# Patient Record
Sex: Female | Born: 1985
Health system: Southern US, Community
[De-identification: ages and names within clinical notes are randomized; demographics above are authoritative.]

## PROBLEM LIST (undated history)

## (undated) DIAGNOSIS — J45909 Unspecified asthma, uncomplicated: Secondary | ICD-10-CM

---

## 2016-05-02 DIAGNOSIS — J020 Streptococcal pharyngitis: Secondary | ICD-10-CM | POA: Diagnosis not present

## 2016-08-25 ENCOUNTER — Encounter (HOSPITAL_COMMUNITY): Payer: Self-pay | Admitting: Emergency Medicine

## 2016-08-25 ENCOUNTER — Emergency Department (HOSPITAL_COMMUNITY)
Admission: EM | Admit: 2016-08-25 | Discharge: 2016-08-25 | Disposition: A | Discharge status: Home / Self Care | Payer: Medicaid Other | Attending: Emergency Medicine | Admitting: Emergency Medicine

## 2016-08-25 DIAGNOSIS — F17200 Nicotine dependence, unspecified, uncomplicated: Secondary | ICD-10-CM | POA: Diagnosis not present

## 2016-08-25 DIAGNOSIS — J45909 Unspecified asthma, uncomplicated: Secondary | ICD-10-CM | POA: Diagnosis not present

## 2016-08-25 DIAGNOSIS — H9202 Otalgia, left ear: Secondary | ICD-10-CM | POA: Insufficient documentation

## 2016-08-25 HISTORY — DX: Unspecified asthma, uncomplicated: J45.909

## 2016-08-25 HISTORY — DX: Morbid (severe) obesity due to excess calories: E66.01

## 2016-08-25 MED ORDER — LIDOCAINE HCL 2 % IJ SOLN
10.0000 mL | Freq: Once | INTRAMUSCULAR | Status: AC
  Administered 2016-08-25: 200 mg
  Filled 2016-08-25: qty 20

## 2016-08-25 MED ORDER — CIPROFLOXACIN-DEXAMETHASONE 0.3-0.1 % OT SUSP: 4.0000 [drp] | Freq: Two times a day (BID) | OTIC | 0 refills | Status: DC

## 2016-08-25 NOTE — ED Triage Notes (Signed)
Left ear pain states she has had some drainage and she has had a migraine may be from ear pain, sharp pain in ear

## 2016-08-25 NOTE — Discharge Instructions (Signed)
You have been seen today for ear pain. There appears to be an infection. Use 4 drops of the prescribed eardrops twice a day for 5 days. Follow-up with primary care provider on this matter. Follow up with the ear nose and throat specialist as needed. Return to the ED for worsening symptoms.  There was signs of ear wax buildup that can contribute to your symptoms. Refer to the attached information sheets.

## 2016-08-25 NOTE — ED Provider Notes (Signed)
[facility]-EMERGENCY DEPT Provider Note   CSN: [id number] Arrival date & time: 08/25/16  1457  By signing my name below, I, Paula Spence, attest that this documentation has been prepared under the direction and in the presence of Virgil Slinger, PA-C. Electronically Signed: Linna Spence, Scribe. 08/25/2016. 4:57 PM.  History   Chief Complaint Chief Complaint  Patient presents with  . Otalgia   The history is provided by the patient. No language interpreter was used.    HPI Comments: Paula Spence is a 31 y.o. female who presents to the Emergency Department complaining of constant left ear pain with occasional light brown discharge beginning this morning.  Also endorses an intermittent headache that has been ongoing for 4-5 days. She states the headache is located behind her bilateral eyes and temples bilaterally. Throbbing, moderate, nonradiating, consistent with previous headaches. 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 She adds that she is not coming in for the headache as this type of headache is typical for her.  No alleviating factors noted.  She denies right ear pain/discharge, fevers, chills, nausea, vomiting, dizziness, neuro deficits, or any other associated symptoms.     Past Medical History:  Diagnosis Date  . Asthma   . Morbid obesity (HCC)     There are no active problems to display for this patient.   No past surgical history on file.  OB History    No data available       Home Medications    Prior to Admission medications   Medication Sig Start Date End Date Taking? Authorizing Provider  ciprofloxacin-dexamethasone (CIPRODEX) OTIC suspension Place 4 drops into the left ear 2 (two) times daily. Use for 5 days. 08/25/16   Anselm Pancoast, PA-C    Family History No family history on file.  Social History Social History  Substance Use Topics  . Smoking status: Current Every Day Smoker  . Smokeless  tobacco: Never Used  . Alcohol use Not on file     Allergies   Patient has no allergy information on record.   Review of Systems Review of Systems  Constitutional: Negative for chills and fever.  HENT: Positive for ear discharge (L) and ear pain (L).   Eyes: Negative for photophobia, pain, discharge and redness.  Gastrointestinal: Negative for nausea and vomiting.  Neurological: Positive for headaches. Negative for dizziness, syncope, weakness, light-headedness and numbness.  All other systems reviewed and are negative.  Physical Exam Updated Vital Signs BP 118/83 (BP Location: Left Arm)   Pulse 80   Temp 98.4 F (36.9 C) (Oral)   Resp 16   SpO2 95%   Physical Exam  Constitutional: She appears well-developed and well-nourished. No distress.  HENT:  Head: Normocephalic and atraumatic.  Mouth/Throat: Oropharynx is clear and moist.  Cerumen buildup bilaterally. Tender tragus on the left but no pain with movement of the auricle. Erythema in the left ear canal with debris.  Reinspection of the left ear showed TM intact. Exudate present. Patient voices significant improvement in symptoms.  Eyes: Conjunctivae and EOM are normal. Pupils are equal, round, and reactive to light.  Neck: Normal range of motion. Neck supple.  Cardiovascular: Normal rate, regular rhythm, normal heart sounds and intact distal pulses.   Pulmonary/Chest: Effort normal and breath sounds normal. No respiratory distress.  Abdominal: There is no guarding.  Musculoskeletal: She exhibits no edema.  Normal motor function intact in all extremities and spine. No midline  spinal tenderness.  Lymphadenopathy:    She has no cervical adenopathy.  Neurological: She is alert.  No sensory deficits. Strength 5/5 in all extremities. No gait disturbance. Coordination intact including heel to shin and finger to nose. Cranial nerves III-XII grossly intact. No facial droop.  Skin: Skin is warm and dry. She is not diaphoretic.    Psychiatric: She has a normal mood and affect. Her behavior is normal.  Nursing note and vitals reviewed.  ED Treatments / Results  Labs (all labs ordered are listed, but only abnormal results are displayed) Labs Reviewed - No data to display  EKG  EKG Interpretation None       Radiology No results found.  Procedures .Ear Cerumen Removal Date/Time: 08/25/2016 5:26 PM Performed by: Bing PlumeHAYNES, BRENDA H Authorized by: Anselm PancoastJOY, Vearl Allbaugh C   Consent:    Consent obtained:  Verbal   Consent given by:  Patient   Risks discussed:  Bleeding, incomplete removal, pain, TM perforation, infection and dizziness Procedure details:    Location:  L ear   Procedure type: irrigation   Post-procedure details:    Inspection:  TM intact   Patient tolerance of procedure:  Tolerated well, no immediate complications     (including critical care time)  DIAGNOSTIC STUDIES: Oxygen Saturation is 95% on RA, adequate by my interpretation.    COORDINATION OF CARE: 4:54 PM Discussed treatment plan with pt at bedside and pt agreed to plan.  Medications Ordered in ED Medications  lidocaine (XYLOCAINE) 2 % (with pres) injection 200 mg (200 mg Other Given 08/25/16 1731)     Initial Impression / Assessment and Plan / ED Course  I have reviewed the triage vital signs and the nursing notes.  Pertinent labs & imaging results that were available during my care of the patient were reviewed by me and considered in my medical decision making (see chart for details).       Patient presents with otalgia. Cerumen impaction cleared. Suspect otitis externa. PCP and/or ENT follow-up. The patient was given instructions for home care as well as return precautions. Patient voices understanding of these instructions, accepts the plan, and is comfortable with discharge.     Final Clinical Impressions(s) / ED Diagnoses   Final diagnoses:  Left ear pain    New Prescriptions New Prescriptions    CIPROFLOXACIN-DEXAMETHASONE (CIPRODEX) OTIC SUSPENSION    Place 4 drops into the left ear 2 (two) times daily. Use for 5 days.   I personally performed the services described in this documentation, which was scribed in my presence. The recorded information has been reviewed and is accurate.   Anselm PancoastJoy, Synia Douglass C, PA-C 08/25/16 1806    Lavera GuiseLiu, Dana Duo, MD 08/25/16 2216

## 2017-02-25 ENCOUNTER — Encounter (HOSPITAL_COMMUNITY): Payer: Self-pay | Admitting: *Deleted

## 2017-02-25 ENCOUNTER — Emergency Department (HOSPITAL_COMMUNITY)
Admission: EM | Admit: 2017-02-25 | Discharge: 2017-02-25 | Disposition: A | Discharge status: Home / Self Care | Payer: Medicaid Other | Attending: Emergency Medicine | Admitting: Emergency Medicine

## 2017-02-25 ENCOUNTER — Other Ambulatory Visit: Payer: Self-pay

## 2017-02-25 DIAGNOSIS — J45909 Unspecified asthma, uncomplicated: Secondary | ICD-10-CM | POA: Diagnosis not present

## 2017-02-25 DIAGNOSIS — N921 Excessive and frequent menstruation with irregular cycle: Secondary | ICD-10-CM | POA: Diagnosis not present

## 2017-02-25 DIAGNOSIS — N939 Abnormal uterine and vaginal bleeding, unspecified: Secondary | ICD-10-CM | POA: Diagnosis present

## 2017-02-25 DIAGNOSIS — F17200 Nicotine dependence, unspecified, uncomplicated: Secondary | ICD-10-CM | POA: Insufficient documentation

## 2017-02-25 LAB — CBC
HEMATOCRIT: 35.5 % — AB (ref 36.0–46.0)
Hemoglobin: 11.3 g/dL — ABNORMAL LOW (ref 12.0–15.0)
MCH: 26.8 pg (ref 26.0–34.0)
MCHC: 31.8 g/dL (ref 30.0–36.0)
MCV: 84.3 fL (ref 78.0–100.0)
Platelets: 226 10*3/uL (ref 150–400)
RBC: 4.21 MIL/uL (ref 3.87–5.11)
RDW: 13.4 % (ref 11.5–15.5)
WBC: 6.6 10*3/uL (ref 4.0–10.5)

## 2017-02-25 LAB — I-STAT BETA HCG BLOOD, ED (MC, WL, AP ONLY): I-stat hCG, quantitative: <5 m[IU]/mL (ref <5)

## 2017-02-25 MED ORDER — SODIUM CHLORIDE 0.9 % IV BOLUS (SEPSIS): 1000.0000 mL | Freq: Once | INTRAVENOUS | Status: DC

## 2017-02-25 MED ORDER — IBUPROFEN 800 MG PO TABS
800.0000 mg | ORAL_TABLET | Freq: Once | ORAL | Status: AC
  Administered 2017-02-25: 800 mg via ORAL
  Filled 2017-02-25: qty 1

## 2017-02-25 MED ORDER — KETOROLAC TROMETHAMINE 30 MG/ML IJ SOLN: 30.0000 mg | Freq: Once | INTRAMUSCULAR | Status: DC

## 2017-02-25 MED ORDER — MEGESTROL ACETATE 40 MG PO TABS: 40.0000 mg | ORAL_TABLET | ORAL | 0 refills | Status: DC

## 2017-02-25 NOTE — Discharge Instructions (Signed)
You are a little anemic, but you do not need transfusion.    Please call [facility] clinic for follow-up.  You are started on Megace.  You will take this until you follow-up with a gynecologist who may provide you with more definitive care.  Please return without fail for worsening symptoms, including persistent significant bleeding, passing out, confusion, or any other symptoms concerning to you.

## 2017-02-25 NOTE — ED Notes (Signed)
Patient verbalizes understanding of discharge instructions. Opportunity for questioning and answers were provided. Armband removed by staff, pt discharged from ED ambulatory.   

## 2017-02-25 NOTE — ED Triage Notes (Addendum)
Pt reports she has been bleeding since June, have been having blood clots for the past 2 months and have been having large one in the last 2 weeks.  Pt endorses lower abd pain that radiates down to her bila legs and dizziness.  She also c/o nausea. She reports she has not seen a GYN recently because she has not had the time.  She is here now because she's not been feeling well and wants to know what's going on, and mother told her to come to the ED today to find out if she has fibroids.

## 2017-02-25 NOTE — ED Provider Notes (Signed)
[facility] Provider Note   CSN: [id number] Arrival date & time: 02/25/17  1119     History   Chief Complaint Chief Complaint  Patient presents with  . Vaginal Bleeding  . Abdominal Pain    HPI Paula Spence is a 31 y.o. female.  HPI  [age] year old female who presents with metromenorrhagia.  She was discontinued on her Mirena several months ago.  States that starting in September she has been having spotting in between heavy periods.  States that this month began passing clots with her..  Over the last 2 days as she has been having very heavy periods, using 2 packs of overnight pads during this time.  Has been feeling lightheaded and with generalized migraine headache.  Denies any syncope but has been feeling lightheaded occasionally.  No chest pain, difficulty breathing, confusion, nausea or vomiting, fevers, urinary complaints or diarrhea.  Describes intermittent abdominal cramping in her pelvis associated with the bleeding.  Past Medical History:  Diagnosis Date  . Asthma   . Morbid obesity (HCC)     There are no active problems to display for this patient.   History reviewed. No pertinent surgical history.  OB History    No data available       Home Medications    Prior to Admission medications   Medication Sig Start Date End Date Taking? Authorizing Provider  naproxen (NAPROSYN) 250 MG tablet Take 250 mg by mouth 2 (two) times daily with a meal.   Yes [provider]  ciprofloxacin-dexamethasone (CIPRODEX) OTIC suspension Place 4 drops into the left ear 2 (two) times daily. Use for 5 days. Patient not taking: Reported on 02/25/2017 08/25/16   Anselm PancoastJoy, Shawn C, PA-C  megestrol (MEGACE) 40 MG tablet Take 1 tablet (40 mg total) by mouth See admin instructions. Take 1 tablet (40 mg) three times daily until bleeding stops. Then take once daily until you follow-up with the gynecologist. 02/25/17   Lavera GuiseLiu, Korde Jeppsen Duo, MD    Family  History No family history on file.  Social History Social History   Tobacco Use  . Smoking status: Current Every Day Smoker  . Smokeless tobacco: Never Used  Substance Use Topics  . Alcohol use: Not on file  . Drug use: Not on file     Allergies   Patient has no known allergies.   Review of Systems Review of Systems  Constitutional: Negative for fever.  Respiratory: Negative for shortness of breath.   Cardiovascular: Negative for chest pain.  Gastrointestinal: Negative for diarrhea and vomiting.  Genitourinary: Positive for pelvic pain and vaginal bleeding. Negative for dysuria.  Hematological: Does not bruise/bleed easily.  All other systems reviewed and are negative.    Physical Exam Updated Vital Signs BP (!) 148/98 (BP Location: Right Arm)   Pulse 80   Temp (!) 97 F (36.1 C) (Oral)   Resp 17   Ht 5\' 7"  (1.702 m)   Wt (!) 166.9 kg (368 lb)   SpO2 98%   BMI 57.64 kg/m   Physical Exam Physical Exam  Nursing note and vitals reviewed. Constitutional: Well developed, well nourished, non-toxic, and in no acute distress Head: Normocephalic and atraumatic.  Mouth/Throat: Oropharynx is clear and moist.  Neck: Normal range of motion. Neck supple.  Cardiovascular: Normal rate and regular rhythm.   Pulmonary/Chest: Effort normal and breath sounds normal.  Abdominal: Soft.  Obese. There is no tenderness. There is no rebound and no guarding.  Pelvic: Normal external  genitalia. Normal internal genitalia. No discharge. Small blood clot and small pool of blood within the vagina.  Mild oozing of blood from cervix. No adnexal masses or tenderness. Musculoskeletal: Normal range of motion.  Neurological: Alert, no facial droop, fluent speech, moves all extremities symmetrically Skin: Skin is warm and dry.  Psychiatric: Cooperative   ED Treatments / Results  Labs (all labs ordered are listed, but only abnormal results are displayed) Labs Reviewed  CBC - Abnormal;  Notable for the following components:      Result Value   Hemoglobin 11.3 (*)    HCT 35.5 (*)    All other components within normal limits  I-STAT BETA HCG BLOOD, ED ([facility], [facility], [facility] ONLY)    EKG  EKG Interpretation None       Radiology No results found.  Procedures Procedures (including critical care time)  Medications Ordered in ED Medications  ibuprofen (ADVIL,MOTRIN) tablet 800 mg (800 mg Oral Given 02/25/17 1438)     Initial Impression / Assessment and Plan / ED Course  I have reviewed the triage vital signs and the nursing notes.  Pertinent labs & imaging results that were available during my care of the patient were reviewed by me and considered in my medical decision making (see chart for details).     31 year old female who presents with metromenorrhagia.  She is nontoxic in no acute distress.  She is hemodynamically stable.  Her abdomen currently is soft and benign.  There is no tenderness.  Pelvic exam does reveal evidence of blood and blood clot in the vagina, but only mild oozing of blood from the cervix.  No severe active bleeding.  Given that she did have severe bleeding at home, will start on Megace temporarily until she sees her gynecologist.  She will follow-up with gynecologist as an outpatient. Strict return and follow-up instructions reviewed. She expressed understanding of all discharge instructions and felt comfortable with the plan of care.   Final Clinical Impressions(s) / ED Diagnoses   Final diagnoses:  Menorrhagia with irregular cycle    ED Discharge Orders        Ordered    megestrol (MEGACE) 40 MG tablet  See admin instructions     02/25/17 1455       Lavera GuiseLiu, Shya Kovatch Duo, MD 02/25/17 1510

## 2017-03-18 ENCOUNTER — Encounter: Payer: Self-pay | Admitting: Obstetrics & Gynecology

## 2017-04-22 ENCOUNTER — Encounter (HOSPITAL_COMMUNITY): Payer: Self-pay | Admitting: Emergency Medicine

## 2017-04-22 ENCOUNTER — Other Ambulatory Visit: Payer: Self-pay

## 2017-04-22 ENCOUNTER — Emergency Department (HOSPITAL_COMMUNITY)
Admission: EM | Admit: 2017-04-22 | Discharge: 2017-04-22 | Disposition: A | Discharge status: Home / Self Care | Payer: Medicaid Other | Attending: Emergency Medicine | Admitting: Emergency Medicine

## 2017-04-22 DIAGNOSIS — B3731 Acute candidiasis of vulva and vagina: Secondary | ICD-10-CM

## 2017-04-22 DIAGNOSIS — R51 Headache: Secondary | ICD-10-CM | POA: Diagnosis not present

## 2017-04-22 DIAGNOSIS — N760 Acute vaginitis: Secondary | ICD-10-CM | POA: Diagnosis not present

## 2017-04-22 DIAGNOSIS — Z87891 Personal history of nicotine dependence: Secondary | ICD-10-CM | POA: Diagnosis not present

## 2017-04-22 DIAGNOSIS — B373 Candidiasis of vulva and vagina: Secondary | ICD-10-CM

## 2017-04-22 DIAGNOSIS — N899 Noninflammatory disorder of vagina, unspecified: Secondary | ICD-10-CM | POA: Diagnosis present

## 2017-04-22 DIAGNOSIS — J45909 Unspecified asthma, uncomplicated: Secondary | ICD-10-CM | POA: Diagnosis not present

## 2017-04-22 DIAGNOSIS — R519 Headache, unspecified: Secondary | ICD-10-CM

## 2017-04-22 LAB — WET PREP, GENITAL
Clue Cells Wet Prep HPF POC: NONE SEEN
Sperm: NONE SEEN
Trich, Wet Prep: NONE SEEN
Yeast Wet Prep HPF POC: NONE SEEN

## 2017-04-22 LAB — POC URINE PREG, ED: PREG TEST UR: NEGATIVE

## 2017-04-22 MED ORDER — FLUCONAZOLE 150 MG PO TABS: 150.0000 mg | ORAL_TABLET | Freq: Every day | ORAL | 0 refills | Status: AC

## 2017-04-22 MED ORDER — FLUCONAZOLE 150 MG PO TABS
150.0000 mg | ORAL_TABLET | Freq: Once | ORAL | Status: AC
  Administered 2017-04-22: 150 mg via ORAL
  Filled 2017-04-22: qty 1

## 2017-04-22 MED ORDER — KETOROLAC TROMETHAMINE 30 MG/ML IJ SOLN
30.0000 mg | Freq: Once | INTRAMUSCULAR | Status: AC
  Administered 2017-04-22: 30 mg via INTRAMUSCULAR
  Filled 2017-04-22: qty 1

## 2017-04-22 NOTE — ED Notes (Signed)
Pelvic cart at bedside. 

## 2017-04-22 NOTE — ED Triage Notes (Signed)
Patient presents to ED for assessment of vaginal discharge since Friday, patient assumed it was a yeast infection and attempted to treat with vinegar soaked tampons.  Patient now c/o red itchy bumps to the area, discomfort, and continued discharge.

## 2017-04-22 NOTE — ED Notes (Signed)
Pt also complaining of migraine at this time.

## 2017-04-22 NOTE — ED Notes (Signed)
Patient verbalized understanding of discharge instructions and denies any further needs or questions at this time. VS stable. Patient ambulatory with steady gait.  

## 2017-04-22 NOTE — Discharge Instructions (Signed)
Please read attached information. If you experience any new or worsening signs or symptoms please return to the emergency room for evaluation. Please follow-up with your primary care provider or specialist as discussed. Please use medication prescribed only as directed and discontinue taking if you have any concerning signs or symptoms.   °

## 2017-04-22 NOTE — ED Provider Notes (Signed)
[facility] EMERGENCY DEPARTMENT Provider Note   CSN: [id number] Arrival date & time: 04/22/17  1012   History   Chief Complaint Chief Complaint  Patient presents with  . Vaginal Discharge    HPI Paula Spence is a 32 y.o. female.  HPI   32 year old female presents today with complaints of vaginal discharge.  Patient reports a 1.5-week history of white vaginal discharge with vaginal itching and irritation.  She notes she tried using apple cider vinegar and a soaked tampon which worsened her symptoms with surrounding redness.  Patient denies any abdominal pain fever chills nausea or vomiting.  She reports she is sexually active with a female partner.  Patient also reports that she is having a headache, generalized head pressure typical of previous with no associated neurological deficits fever, neck stiffness or any red flags.  Patient reports ibuprofen did not improve her symptoms.  Past Medical History:  Diagnosis Date  . Asthma   . Morbid obesity (HCC)     There are no active problems to display for this patient.   History reviewed. No pertinent surgical history.  OB History    No data available       Home Medications    Prior to Admission medications   Medication Sig Start Date End Date Taking? Authorizing Provider  naproxen sodium (ALEVE) 220 MG tablet Take 880 mg by mouth 2 (two) times daily as needed (pain).    Yes [provider]  fluconazole (DIFLUCAN) 150 MG tablet Take 1 tablet (150 mg total) by mouth daily for 1 day. 04/22/17 04/23/17  Eyvonne Mechanic, PA-C    Family History History reviewed. No pertinent family history.  Social History Social History   Tobacco Use  . Smoking status: Former Games developer  . Smokeless tobacco: Never Used  Substance Use Topics  . Alcohol use: Yes    Comment: socially  . Drug use: No     Allergies   Patient has no known allergies.   Review of Systems Review of Systems  All other systems  reviewed and are negative.   Physical Exam Updated Vital Signs BP 107/63 (BP Location: Right Arm)   Pulse 78   Temp 97.8 F (36.6 C) (Oral)   Resp 18   SpO2 98%   Physical Exam  Constitutional: She is oriented to person, place, and time. She appears well-developed and well-nourished.  HENT:  Head: Normocephalic and atraumatic.  Eyes: Conjunctivae are normal. Pupils are equal, round, and reactive to light. Right eye exhibits no discharge. Left eye exhibits no discharge. No scleral icterus.  Neck: Normal range of motion. No JVD present. No tracheal deviation present.  Pulmonary/Chest: Effort normal. No stridor.  Abdominal: Soft. She exhibits no distension. There is no tenderness.  Genitourinary:  Genitourinary Comments: No significant vaginal discharge noted, irritation noted on labia majora and surrounding tissue with satellite lesions bilateral-no cervical motion tenderness  Neurological: She is alert and oriented to person, place, and time. She has normal strength. No cranial nerve deficit or sensory deficit. Coordination normal. GCS eye subscore is 4. GCS verbal subscore is 5. GCS motor subscore is 6.  Psychiatric: She has a normal mood and affect. Her behavior is normal. Judgment and thought content normal.  Nursing note and vitals reviewed.    ED Treatments / Results  Labs (all labs ordered are listed, but only abnormal results are displayed) Labs Reviewed  WET PREP, GENITAL - Abnormal; Notable for the following components:      Result  Value   WBC, Wet Prep HPF POC MANY (*)    All other components within normal limits  POC URINE PREG, ED  GC/CHLAMYDIA PROBE AMP ([place]    EKG  EKG Interpretation None       Radiology No results found.  Procedures Procedures (including critical care time)  Medications Ordered in ED Medications  fluconazole (DIFLUCAN) tablet 150 mg (not administered)  ketorolac (TORADOL) 30 MG/ML injection 30 mg (not  administered)     Initial Impression / Assessment and Plan / ED Course  I have reviewed the triage vital signs and the nursing notes.  Pertinent labs & imaging results that were available during my care of the patient were reviewed by me and considered in my medical decision making (see chart for details).     Final Clinical Impressions(s) / ED Diagnoses   Final diagnoses:  Candida vaginitis  Acute nonintractable headache, unspecified headache type    Labs:  POC urine preg, wet prep, GC  Imaging:   Consults:  Therapeutics: fluconazole  Discharge Meds:   Assessment/Plan: 32 year old female presents today with likely Candida infection.  Patient was given a dose of oral fluconazole here, repeat dose at home.  If she continues to have symptoms beyond treatment she will follow-up as a outpatient with OB/GYN or primary care.  Patient with headache, no red flags, Toradol given.  She has no other acute concerns here, she will be discharged with strict return precautions.  She verbalized understanding and agreement to today's plan.     ED Discharge Orders        Ordered    fluconazole (DIFLUCAN) 150 MG tablet  Daily     04/22/17 1523       Eyvonne MechanicHedges, Jerret Mcbane, PA-C 04/22/17 1524    Cathren LaineSteinl, Kevin, MD 04/22/17 2144

## 2017-04-23 LAB — GC/CHLAMYDIA PROBE AMP (~~LOC~~) NOT AT ARMC
Chlamydia: NEGATIVE
Neisseria Gonorrhea: NEGATIVE

## 2018-01-18 ENCOUNTER — Emergency Department (HOSPITAL_COMMUNITY)
Admission: EM | Admit: 2018-01-18 | Discharge: 2018-01-18 | Disposition: A | Discharge status: Home / Self Care | Payer: Medicaid Other | Attending: Emergency Medicine | Admitting: Emergency Medicine

## 2018-01-18 ENCOUNTER — Encounter (HOSPITAL_COMMUNITY): Payer: Self-pay | Admitting: Emergency Medicine

## 2018-01-18 ENCOUNTER — Other Ambulatory Visit: Payer: Self-pay

## 2018-01-18 ENCOUNTER — Emergency Department (HOSPITAL_COMMUNITY): Payer: Medicaid Other

## 2018-01-18 DIAGNOSIS — J181 Lobar pneumonia, unspecified organism: Secondary | ICD-10-CM | POA: Diagnosis not present

## 2018-01-18 DIAGNOSIS — J45909 Unspecified asthma, uncomplicated: Secondary | ICD-10-CM | POA: Diagnosis not present

## 2018-01-18 DIAGNOSIS — Z87891 Personal history of nicotine dependence: Secondary | ICD-10-CM | POA: Diagnosis not present

## 2018-01-18 DIAGNOSIS — R05 Cough: Secondary | ICD-10-CM | POA: Diagnosis not present

## 2018-01-18 DIAGNOSIS — J189 Pneumonia, unspecified organism: Secondary | ICD-10-CM | POA: Diagnosis not present

## 2018-01-18 MED ORDER — BENZONATATE 100 MG PO CAPS: 100.0000 mg | ORAL_CAPSULE | Freq: Three times a day (TID) | ORAL | 0 refills | Status: AC

## 2018-01-18 MED ORDER — AZITHROMYCIN 250 MG PO TABS: 250.0000 mg | ORAL_TABLET | Freq: Every day | ORAL | 0 refills | Status: AC

## 2018-01-18 MED ORDER — AZITHROMYCIN 250 MG PO TABS
500.0000 mg | ORAL_TABLET | Freq: Once | ORAL | Status: AC
  Administered 2018-01-18: 500 mg via ORAL
  Filled 2018-01-18: qty 2

## 2018-01-18 NOTE — ED Notes (Signed)
Patient transported to X-ray 

## 2018-01-18 NOTE — ED Triage Notes (Signed)
Pt reports that coworkers have had the flu. Pt had non productive cough, intermittent fevers and takes Ibuprofen for it, fatigue, diarrhea occasionally. Adds that in mornings will vomit, believes mainly due to post nasal drainage.

## 2018-01-18 NOTE — ED Provider Notes (Signed)
[facility]-EMERGENCY DEPT Provider Note   CSN: [id number] Arrival date & time: 01/18/18  1035     History   Chief Complaint Chief Complaint  Patient presents with  . Cough  . Fatigue    HPI Paula Spence is a 32 y.o. female.  Patient presents to the emergency department today with complaints of ongoing dry cough and fatigue.  Patient states that she has had a cough that is nonproductive for about the past 2 weeks.  She has had associated runny nose, mild sore throat.  She denies ear pain.  No nausea, vomiting, or diarrhea.  She has been taking over-the-counter medications without improvement.  She has been around several people at work who have had "the flu".  Patient states that she has been having intermittent subjective fevers and chills.  No significant chest pain except when she coughs.  No lower extremity swelling.  The onset of this condition was acute. The course is constant. Aggravating factors: none. Alleviating factors: none.        Past Medical History:  Diagnosis Date  . Asthma   . Morbid obesity (HCC)     There are no active problems to display for this patient.   History reviewed. No pertinent surgical history.   OB History   None      Home Medications    Prior to Admission medications   Medication Sig Start Date End Date Taking? Authorizing Provider  ibuprofen (ADVIL,MOTRIN) 200 MG tablet Take 200 mg by mouth every 4 (four) hours as needed for fever.   Yes [provider]  naproxen sodium (ALEVE) 220 MG tablet Take 880 mg by mouth 2 (two) times daily as needed (pain).    Yes [provider]  azithromycin (ZITHROMAX) 250 MG tablet Take 1 tablet (250 mg total) by mouth daily. 01/18/18   Renne Crigler, PA-C  benzonatate (TESSALON) 100 MG capsule Take 1 capsule (100 mg total) by mouth every 8 (eight) hours. 01/18/18   Renne Crigler, PA-C    Family History No family history on file.  Social History Social  History   Tobacco Use  . Smoking status: Former Games developer  . Smokeless tobacco: Never Used  Substance Use Topics  . Alcohol use: Yes    Comment: socially  . Drug use: No     Allergies   Patient has no known allergies.   Review of Systems Review of Systems  Constitutional: Positive for chills, fatigue and fever.  HENT: Positive for congestion, rhinorrhea and sore throat. Negative for ear pain and sinus pressure.   Eyes: Negative for redness.  Respiratory: Positive for cough. Negative for wheezing.   Gastrointestinal: Negative for abdominal pain, diarrhea, nausea and vomiting.  Genitourinary: Negative for dysuria.  Musculoskeletal: Positive for myalgias. Negative for neck stiffness.  Skin: Negative for rash.  Neurological: Negative for headaches.  Hematological: Negative for adenopathy.     Physical Exam Updated Vital Signs BP (!) 146/100   Pulse 79   Temp 98.3 F (36.8 C) (Oral)   Resp 16   Ht 5' 6.5" (1.689 m)   Wt 136.1 kg   LMP 12/24/2017   SpO2 100%   BMI 47.70 kg/m   Physical Exam  Constitutional: She appears well-developed and well-nourished.  HENT:  Head: Normocephalic and atraumatic.  Right Ear: Tympanic membrane, external ear and ear canal normal.  Left Ear: Tympanic membrane, external ear and ear canal normal.  Nose: Mucosal edema present. No rhinorrhea.  Mouth/Throat: Uvula is midline and  mucous membranes are normal. Mucous membranes are not dry. No oral lesions. No trismus in the jaw. No uvula swelling. Posterior oropharyngeal erythema present. No oropharyngeal exudate, posterior oropharyngeal edema or tonsillar abscesses.  Eyes: Conjunctivae are normal. Right eye exhibits no discharge. Left eye exhibits no discharge.  Neck: Normal range of motion. Neck supple.  Cardiovascular: Normal rate, regular rhythm and normal heart sounds.  Pulmonary/Chest: Effort normal and breath sounds normal. No respiratory distress. She has no wheezes. She has no rales.    Abdominal: Soft. There is no tenderness. There is no rebound and no guarding.  Lymphadenopathy:    She has no cervical adenopathy.  Neurological: She is alert.  Skin: Skin is warm and dry.  Psychiatric: She has a normal mood and affect.  Nursing note and vitals reviewed.    ED Treatments / Results  Labs (all labs ordered are listed, but only abnormal results are displayed) Labs Reviewed - No data to display  EKG None  Radiology Dg Chest 2 View  Result Date: 01/18/2018 CLINICAL DATA:  Nonproductive cough and intermittent fevers associated with fatigue and occasional diarrhea and morning vomiting. Also shortness of breath and mid chest pain. Former smoker. Exposed to the flu. EXAM: CHEST - 2 VIEW COMPARISON:  PA and lateral chest x-ray of June 22, 2015 FINDINGS: The lungs are well-expanded. There is hazy increased density in the anterior aspect of the right middle lobe. The left lung is clear. There is no pleural effusion. The heart and pulmonary vascularity are normal. The mediastinum is normal in width. The trachea is midline. The bony thorax exhibits no acute abnormality. IMPRESSION: There is atelectasis or pneumonia anteriorly in the right middle lobe. Electronically Signed   By: David  SwazilandJordan M.D.   On: 01/18/2018 11:50    Procedures Procedures (including critical care time)  Medications Ordered in ED Medications  azithromycin (ZITHROMAX) tablet 500 mg (500 mg Oral Given 01/18/18 1216)     Initial Impression / Assessment and Plan / ED Course  I have reviewed the triage vital signs and the nursing notes.  Pertinent labs & imaging results that were available during my care of the patient were reviewed by me and considered in my medical decision making (see chart for details).     Patient seen and examined.   Vital signs reviewed and are as follows: BP 108/76 (BP Location: Right Arm)   Pulse 68   Temp 98.3 F (36.8 C)   Resp 20   Ht 5' 6.5" (1.689 m)   Wt 136.1 kg    LMP 12/24/2017   SpO2 100%   BMI 47.70 kg/m   Chest x-ray reveals possible right middle lobe atelectasis versus pneumonia.  Given patient symptoms, will cover for pneumonia.  She was updated on results.  No concerns regarding discharge at this time.  Patient is in no respiratory distress, has no fever, no tachycardia, and no hypoxia.  Patient be started on azithromycin.  She is encouraged to rest for the next couple of days.  Return to the emergency department with worsening shortness of breath or trouble breathing.  She is encouraged to follow-up with her doctor in 3 days to ensure improvement and resolution.  Final Clinical Impressions(s) / ED Diagnoses   Final diagnoses:  Community acquired pneumonia of right middle lobe of lung (HCC)   Patient with community-acquired pneumonia.  No concern for sepsis.  Patient appears well in no respiratory distress.  Indications for further work-up at this time.  She does  not have any underlying conditions which would make her situation more risky.  ED Discharge Orders         Ordered    azithromycin (ZITHROMAX) 250 MG tablet  Daily     01/18/18 1230    benzonatate (TESSALON) 100 MG capsule  Every 8 hours     01/18/18 1230           Renne Crigler, PA-C 01/18/18 1314    Pricilla Loveless, MD 01/18/18 1547

## 2018-01-18 NOTE — ED Notes (Signed)
Pt reports that she has been having generalized pain, pain with coughing in her chest , and states that she has had on and off fever. Pt states that she feels acid in her throat trying to come up.

## 2018-01-18 NOTE — Discharge Instructions (Signed)
Please read and follow all provided instructions.  Your diagnoses today include:  1. Community acquired pneumonia of right middle lobe of lung (HCC)     Tests performed today include:  Chest x-ray -- shows pneumonia  Vital signs. See below for your results today.   Medications prescribed:   Azithromycin - antibiotic for respiratory infection  You have been prescribed an antibiotic medicine: take the entire course of medicine even if you are feeling better. Stopping early can cause the antibiotic not to work.   Tessalon Perles - cough suppressant medication  Take any prescribed medications only as directed.  Home care instructions:  Follow any educational materials contained in this packet.  Take the complete course of antibiotics that you were prescribed.   BE VERY CAREFUL not to take multiple medicines containing Tylenol (also called acetaminophen). Doing so can lead to an overdose which can damage your liver and cause liver failure and possibly death.   Follow-up instructions: Please follow-up with your primary care provider in the next 3 days for further evaluation of your symptoms and to ensure resolution of your infection.   Return instructions:   Please return to the Emergency Department if you experience worsening symptoms.   Return immediately with worsening breathing, worsening shortness of breath, or if you feel it is taking you more effort to breathe.   Please return if you have any other emergent concerns.  Additional Information:  Your vital signs today were: BP (!) 146/100    Pulse 79    Temp 98.3 F (36.8 C) (Oral)    Resp 16    Ht 5' 6.5" (1.689 m)    Wt 136.1 kg    LMP 12/24/2017    SpO2 100%    BMI 47.70 kg/m  If your blood pressure (BP) was elevated above 135/85 this visit, please have this repeated by your doctor within one month. --------------

## 2019-06-18 ENCOUNTER — Ambulatory Visit: Payer: Medicaid Other | Attending: Internal Medicine

## 2019-06-18 DIAGNOSIS — Z23 Encounter for immunization: Secondary | ICD-10-CM

## 2019-06-18 NOTE — Progress Notes (Signed)
   Covid-19 Vaccination Clinic  Name:  Paula Spence    MRN: [id number] DOB: 1985/09/08  06/18/2019  [name] was observed post Covid-19 immunization for 15 minutes without incident. She was provided with Vaccine Information Sheet and instruction to access the V-Safe system.   [name] was instructed to call 911 with any severe reactions post vaccine: Marland Kitchen Difficulty breathing  . Swelling of face and throat  . A fast heartbeat  . A bad rash all over body  . Dizziness and weakness   Immunizations Administered    Name Date Dose VIS Date Route   Pfizer COVID-19 Vaccine 06/18/2019 10:09 AM 0.3 mL 02/11/2019 Intramuscular   Manufacturer: [employer], [employer]   Lot: [id number]   NDC: [address]-0

## 2019-07-11 ENCOUNTER — Ambulatory Visit: Payer: Medicaid Other | Attending: Internal Medicine

## 2019-07-11 DIAGNOSIS — Z23 Encounter for immunization: Secondary | ICD-10-CM

## 2019-07-11 NOTE — Progress Notes (Signed)
   Covid-19 Vaccination Clinic  Name:  Paula Spence    MRN: [id number] DOB: 08-Sep-1985  07/11/2019  Paula Spence was observed post Covid-19 immunization for 15 minutes without incident. She was provided with Vaccine Information Sheet and instruction to access the V-Safe system.   Paula Spence was instructed to call 911 with any severe reactions post vaccine: Marland Kitchen Difficulty breathing  . Swelling of face and throat  . A fast heartbeat  . A bad rash all over body  . Dizziness and weakness   Immunizations Administered    Name Date Dose VIS Date Route   Pfizer COVID-19 Vaccine 07/11/2019  9:57 AM 0.3 mL 04/27/2018 Intramuscular   Manufacturer: [employer], [employer]   Lot: [id number]   NDC: [address]-6

## 2019-08-23 DIAGNOSIS — Z5181 Encounter for therapeutic drug level monitoring: Secondary | ICD-10-CM | POA: Diagnosis not present

## 2019-08-24 DIAGNOSIS — Z5181 Encounter for therapeutic drug level monitoring: Secondary | ICD-10-CM | POA: Diagnosis not present

## 2019-09-28 DIAGNOSIS — Z5181 Encounter for therapeutic drug level monitoring: Secondary | ICD-10-CM | POA: Diagnosis not present

## 2019-09-29 DIAGNOSIS — Z5181 Encounter for therapeutic drug level monitoring: Secondary | ICD-10-CM | POA: Diagnosis not present

## 2019-11-19 IMAGING — CR DG CHEST 2V
2 series · 2 of 2 positions shown · non-contrast
Comparison: PA and lateral chest x-ray June 22, 2015

CLINICAL DATA: Nonproductive cough and intermittent fevers
associated with fatigue and occasional diarrhea and morning
vomiting. Also shortness of breath and mid chest pain. Former
smoker. Exposed to the flu.

EXAM:
CHEST - 2 VIEW

[w chest pa]
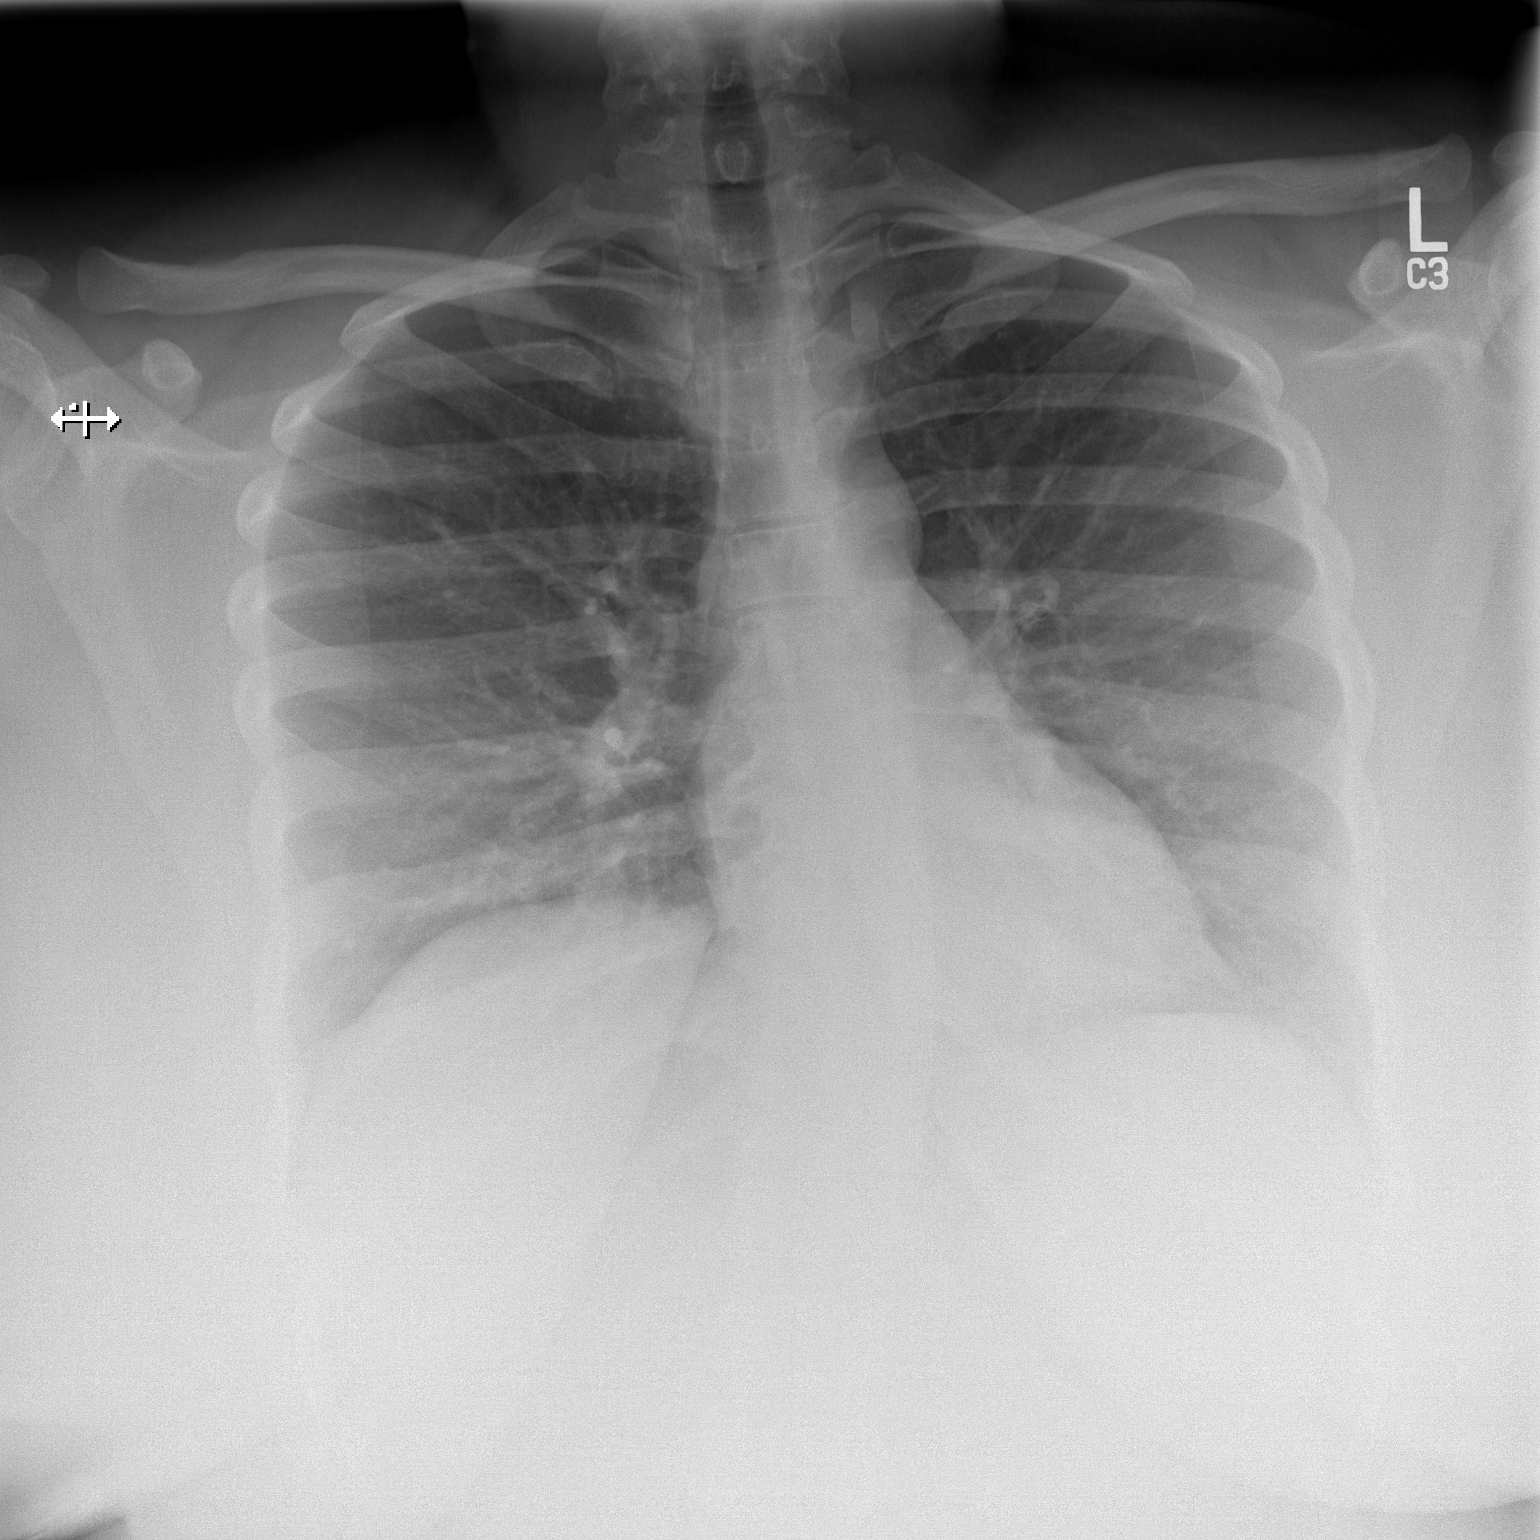

[w chest lat]
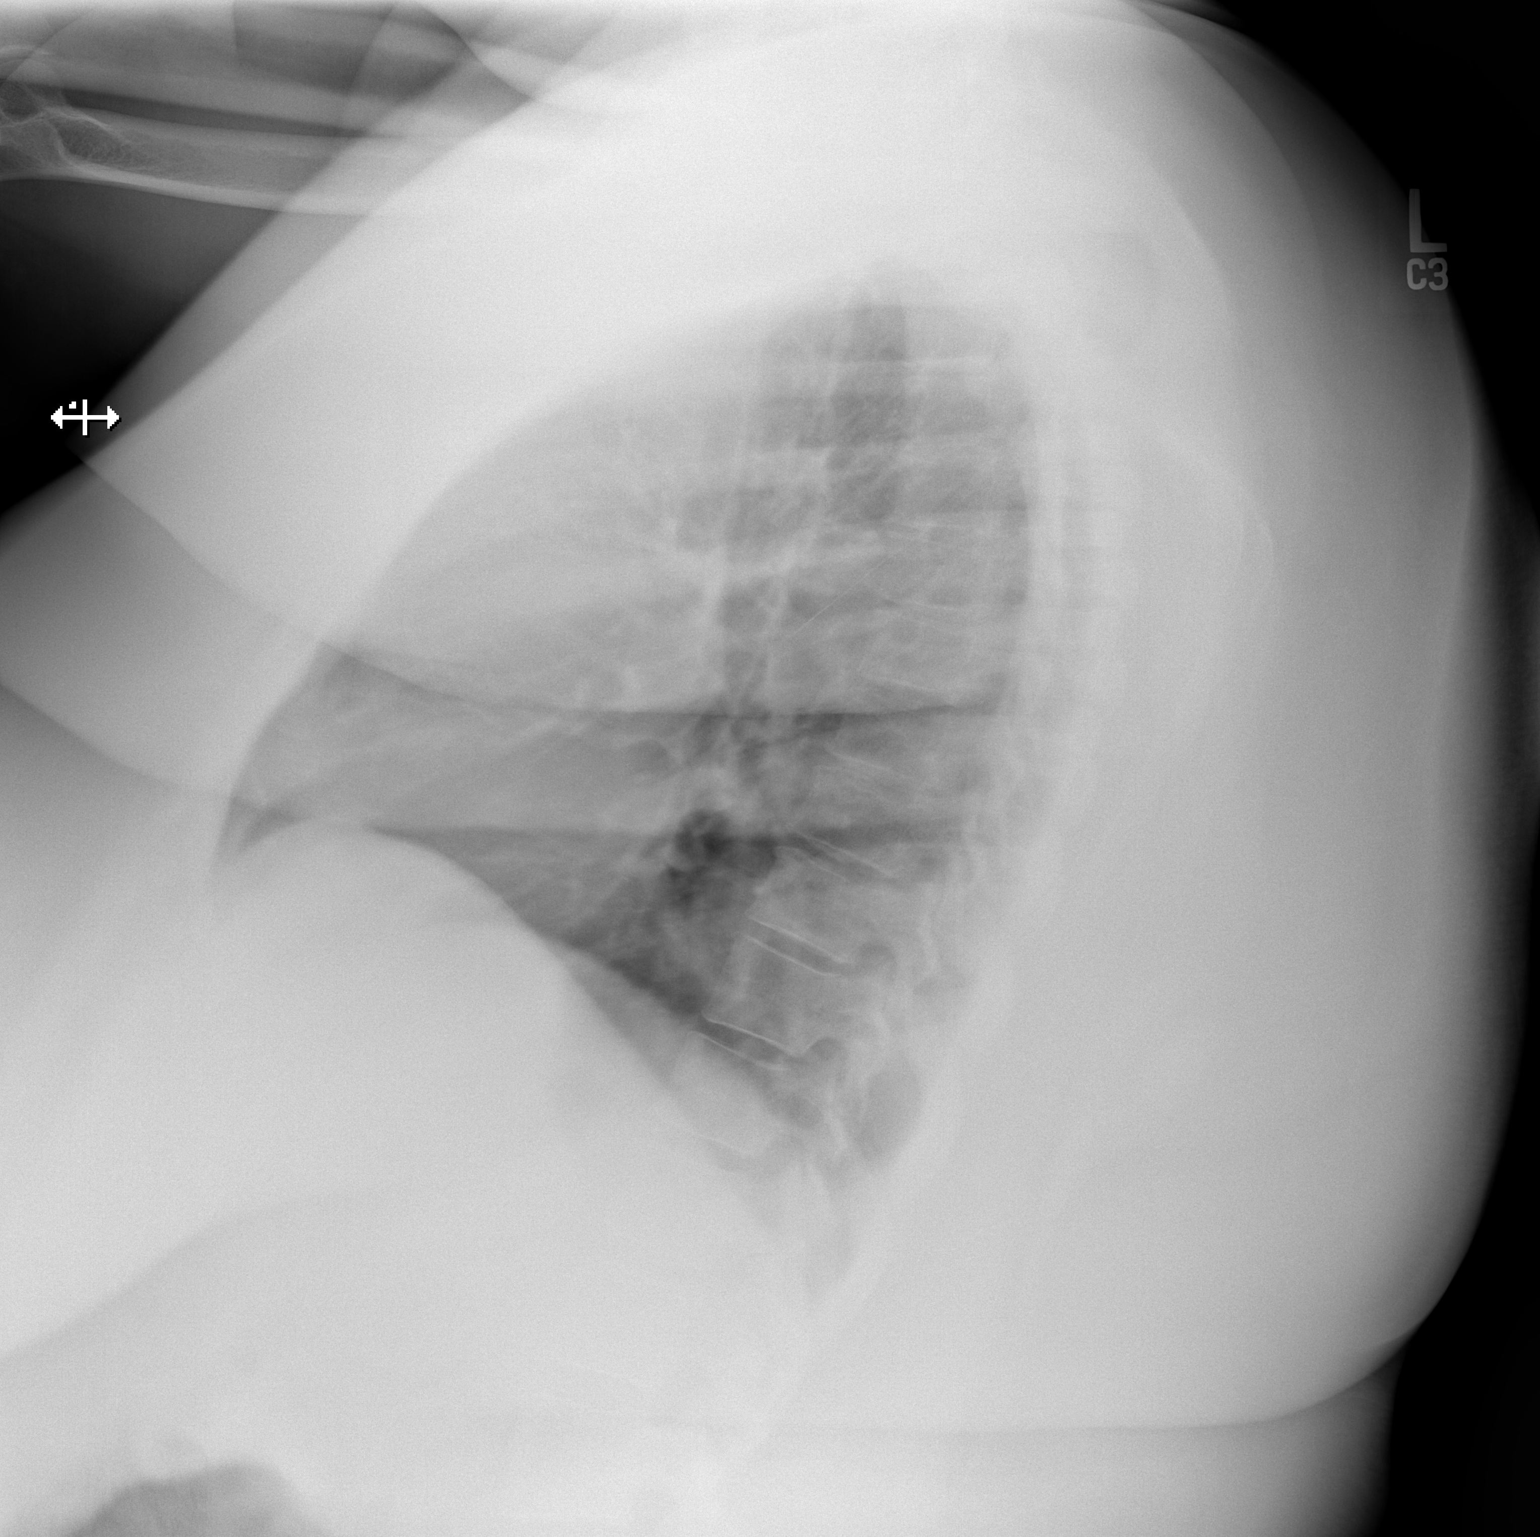

[2 of 2 positions shown; findings below may reference images not displayed]

FINDINGS: The lungs are well-expanded. There is hazy increased density in the
anterior aspect of the right middle lobe. The left lung is clear..
There is no pleural effusion. The heart and pulmonary vascularity
are normal. The mediastinum is normal in width. The trachea is
midline. The bony thorax exhibits no acute abnormality.
IMPRESSION: There is atelectasis or pneumonia anteriorly in the right middle
lobe.

## 2021-01-02 ENCOUNTER — Other Ambulatory Visit: Payer: Self-pay

## 2021-01-02 ENCOUNTER — Encounter (HOSPITAL_COMMUNITY): Payer: Self-pay | Admitting: Emergency Medicine

## 2021-01-02 ENCOUNTER — Ambulatory Visit (HOSPITAL_COMMUNITY)
Admission: EM | Admit: 2021-01-02 | Discharge: 2021-01-02 | Disposition: A | Discharge status: Home / Self Care | Payer: Medicaid Other | Attending: Internal Medicine | Admitting: Internal Medicine

## 2021-01-02 DIAGNOSIS — J111 Influenza due to unidentified influenza virus with other respiratory manifestations: Secondary | ICD-10-CM | POA: Diagnosis not present

## 2021-01-02 DIAGNOSIS — Z8709 Personal history of other diseases of the respiratory system: Secondary | ICD-10-CM

## 2021-01-02 MED ORDER — FLUTICASONE PROPIONATE 50 MCG/ACT NA SUSP: 2.0000 | Freq: Every day | NASAL | 0 refills | Status: AC

## 2021-01-02 MED ORDER — OSELTAMIVIR PHOSPHATE 75 MG PO CAPS: 75.0000 mg | ORAL_CAPSULE | Freq: Two times a day (BID) | ORAL | 0 refills | Status: AC

## 2021-01-02 MED ORDER — ALBUTEROL SULFATE HFA 108 (90 BASE) MCG/ACT IN AERS: 1.0000 | INHALATION_SPRAY | Freq: Four times a day (QID) | RESPIRATORY_TRACT | 0 refills | Status: AC | PRN

## 2021-01-02 MED ORDER — ACETAMINOPHEN 325 MG PO TABS
650.0000 mg | ORAL_TABLET | Freq: Once | ORAL | Status: AC
  Administered 2021-01-02: 650 mg via ORAL

## 2021-01-02 MED ORDER — ACETAMINOPHEN 325 MG PO TABS
ORAL_TABLET | ORAL | Status: AC
  Filled 2021-01-02: qty 2

## 2021-01-02 NOTE — ED Provider Notes (Signed)
[facility]    CSN: [id number] Arrival date & time: 01/02/21  1736      History   Chief Complaint Chief Complaint  Patient presents with   Cough   Headache   Fever   Generalized Body Aches    HPI Paula Spence is a 35 y.o. female.   HPI  Cough: Patient reports that over the past 1 to 2 days she has developed symptoms of dry cough, headache, fever body aches.  No known sick contacts but her coworker does have young children who she thinks may have been sick.  She has been using over-the-counter cough and cold medication without much relief.  She denies any significant shortness of breath but does have asthma.  No vomiting, abdominal pain or chest pain.  Past Medical History:  Diagnosis Date   Asthma    Morbid obesity (HCC)     There are no problems to display for this patient.   History reviewed. No pertinent surgical history.  OB History   No obstetric history on file.      Home Medications    Prior to Admission medications   Medication Sig Start Date End Date Taking? Authorizing Provider  albuterol (VENTOLIN HFA) 108 (90 Base) MCG/ACT inhaler Inhale 1-2 puffs into the lungs every 6 (six) hours as needed for wheezing or shortness of breath. 01/02/21  Yes Kree Armato M, PA-C  fluticasone [facility]) 50 MCG/ACT nasal spray Place 2 sprays into both nostrils daily. 01/02/21  Yes Lyall Faciane, Maralyn Sago M, PA-C  oseltamivir (TAMIFLU) 75 MG capsule Take 1 capsule (75 mg total) by mouth every 12 (twelve) hours. 01/02/21  Yes Asja Frommer, Maralyn Sago M, PA-C  azithromycin (ZITHROMAX) 250 MG tablet Take 1 tablet (250 mg total) by mouth daily. 01/18/18   Renne Crigler, PA-C  benzonatate (TESSALON) 100 MG capsule Take 1 capsule (100 mg total) by mouth every 8 (eight) hours. 01/18/18   Renne Crigler, PA-C  ibuprofen (ADVIL,MOTRIN) 200 MG tablet Take 200 mg by mouth every 4 (four) hours as needed for fever.    [provider]  naproxen sodium (ALEVE) 220 MG tablet Take  880 mg by mouth 2 (two) times daily as needed (pain).     [provider]    Family History History reviewed. No pertinent family history.  Social History Social History   Tobacco Use   Smoking status: Former   Smokeless tobacco: Never  Substance Use Topics   Alcohol use: Yes    Comment: socially   Drug use: No     Allergies   Patient has no known allergies.   Review of Systems Review of Systems  As stated above in HPI Physical Exam Triage Vital Signs ED Triage Vitals  Enc Vitals Group     BP 01/02/21 1902 (!) 142/95     Pulse Rate 01/02/21 1902 (!) 116     Resp 01/02/21 1902 16     Temp 01/02/21 1902 (!) 102.3 F (39.1 C)     Temp Source 01/02/21 1902 Oral     SpO2 01/02/21 1902 98 %     Weight --      Height --      Head Circumference --      Peak Flow --      Pain Score 01/02/21 1858 8     Pain Loc --      Pain Edu? --      Excl. in GC? --    No data found.  Updated Vital Signs  BP (!) 142/95 (BP Location: Right Wrist)   Pulse (!) 116   Temp (!) 102.3 F (39.1 C) (Oral)   Resp 16   LMP 12/25/2020   SpO2 98%   Physical Exam Vitals and nursing note reviewed.  Constitutional:      General: She is not in acute distress.    Appearance: She is well-developed. She is not ill-appearing, toxic-appearing or diaphoretic.  HENT:     Head: Normocephalic and atraumatic.     Right Ear: Tympanic membrane normal.     Left Ear: Tympanic membrane normal.     Nose: Rhinorrhea present.     Mouth/Throat:     Mouth: Mucous membranes are moist.     Pharynx: Oropharynx is clear.  Eyes:     Extraocular Movements: Extraocular movements intact.     Pupils: Pupils are equal, round, and reactive to light.  Cardiovascular:     Rate and Rhythm: Normal rate and regular rhythm.     Heart sounds: Normal heart sounds.  Pulmonary:     Effort: Pulmonary effort is normal.     Breath sounds: Normal breath sounds.  Musculoskeletal:     Cervical back: Normal range of  motion and neck supple.  Lymphadenopathy:     Cervical: No cervical adenopathy.  Skin:    General: Skin is warm.     Coloration: Skin is not cyanotic.  Neurological:     Mental Status: She is alert and oriented to person, place, and time.     UC Treatments / Results  Labs (all labs ordered are listed, but only abnormal results are displayed) Labs Reviewed - No data to display  EKG   Radiology No results found.  Procedures Procedures (including critical care time)  Medications Ordered in UC Medications  acetaminophen (TYLENOL) tablet 650 mg (650 mg Oral Given 01/02/21 1906)    Initial Impression / Assessment and Plan / UC Course  I have reviewed the triage vital signs and the nursing notes.  Pertinent labs & imaging results that were available during my care of the patient were reviewed by me and considered in my medical decision making (see chart for details).     New.  Based on history, vital signs and physical exam findings along with her current significant outbreak of influenza I am going to treat her as such.  Treating with supportive medications and Tamiflu.  Discussed with patient.  Rest and hydration encouraged.  Red flag signs and symptoms encouraged.  Follow-up as needed Final Clinical Impressions(s) / UC Diagnoses   Final diagnoses:  Influenza-like illness  History of asthma   Discharge Instructions   None    ED Prescriptions     Medication Sig Dispense Auth. Provider   albuterol (VENTOLIN HFA) 108 (90 Base) MCG/ACT inhaler Inhale 1-2 puffs into the lungs every 6 (six) hours as needed for wheezing or shortness of breath. 1 each Ilisha Blust M, PA-C   oseltamivir (TAMIFLU) 75 MG capsule Take 1 capsule (75 mg total) by mouth every 12 (twelve) hours. 10 capsule Dev Dhondt M, PA-C   fluticasone [facility]) 50 MCG/ACT nasal spray Place 2 sprays into both nostrils daily. 16 mL Rushie Chestnut, [place]      PDMP not reviewed this encounter.    Rushie Chestnut, [place] 01/02/21 1923

## 2021-01-02 NOTE — ED Triage Notes (Signed)
Pt presents with cough, headache, body aches, and fever that started yesterday.

## 2021-01-03 ENCOUNTER — Telehealth (HOSPITAL_COMMUNITY): Payer: Self-pay | Admitting: Emergency Medicine

## 2021-06-26 DIAGNOSIS — N390 Urinary tract infection, site not specified: Secondary | ICD-10-CM | POA: Diagnosis not present

## 2023-10-01 ENCOUNTER — Ambulatory Visit
Admission: RE | Admit: 2023-10-01 | Discharge: 2023-10-01 | Disposition: A | Discharge status: Home / Self Care | Source: Ambulatory Visit | Attending: Family Medicine | Admitting: Family Medicine

## 2023-10-01 VITALS — BP 111/70 | HR 77 | Temp 97.8°F | Resp 18

## 2023-10-01 DIAGNOSIS — R350 Frequency of micturition: Secondary | ICD-10-CM | POA: Insufficient documentation

## 2023-10-01 DIAGNOSIS — R81 Glycosuria: Secondary | ICD-10-CM | POA: Diagnosis not present

## 2023-10-01 DIAGNOSIS — R739 Hyperglycemia, unspecified: Secondary | ICD-10-CM | POA: Insufficient documentation

## 2023-10-01 DIAGNOSIS — N3001 Acute cystitis with hematuria: Secondary | ICD-10-CM | POA: Insufficient documentation

## 2023-10-01 LAB — POCT URINE DIPSTICK
Bilirubin, UA: NEGATIVE
Glucose, UA: >=1000 mg/dL — AB
Ketones, POC UA: NEGATIVE mg/dL
Nitrite, UA: POSITIVE — AB
POC PROTEIN,UA: NEGATIVE
Spec Grav, UA: 1.02 (ref 1.010–1.025)
Urobilinogen, UA: 0.2 U/dL
pH, UA: 5.5 (ref 5.0–8.0)

## 2023-10-01 LAB — POCT URINE PREGNANCY: Preg Test, Ur: NEGATIVE

## 2023-10-01 LAB — GLUCOSE, POCT (MANUAL RESULT ENTRY): POC Glucose: 255 mg/dL — AB (ref 70–99)

## 2023-10-01 MED ORDER — CEPHALEXIN 500 MG PO CAPS: 500.0000 mg | ORAL_CAPSULE | Freq: Two times a day (BID) | ORAL | 0 refills | Status: AC

## 2023-10-01 NOTE — ED Provider Notes (Signed)
 [facility]    CSN: [id number] Arrival date & time: 10/01/23  1717      History   Chief Complaint Chief Complaint  Patient presents with   Abdominal Pain    I believe I have a uti. I have not been sexually active , just frequent urination. - Entered by patient    HPI Liala Codispoti is a 38 y.o. female with a past medical history of asthma and obesity presents for urinary symptoms.  Patient reports 1 week of urinary frequency with irritation.  Denies burning with urination, urgency, hematuria, fevers, nausea/vomiting, flank pain.  No vaginal discharge or STD concern.  Denies history of diabetes.  She took over-the-counter Uristat for symptoms.  No other concerns at this time.   Abdominal Pain   Past Medical History:  Diagnosis Date   Asthma    Morbid obesity (HCC)     There are no active problems to display for this patient.   History reviewed. No pertinent surgical history.  OB History   No obstetric history on file.      Home Medications    Prior to Admission medications   Medication Sig Start Date End Date Taking? Authorizing Provider  cephALEXin  (KEFLEX ) 500 MG capsule Take 1 capsule (500 mg total) by mouth 2 (two) times daily for 7 days. 10/01/23 10/08/23 Yes Aidynn Krenn, Jodi R, NP  albuterol  (VENTOLIN  HFA) 108 (90 Base) MCG/ACT inhaler Inhale 1-2 puffs into the lungs every 6 (six) hours as needed for wheezing or shortness of breath. 01/02/21   Tonette Lauraine HERO, PA-C  azithromycin  (ZITHROMAX ) 250 MG tablet Take 1 tablet (250 mg total) by mouth daily. 01/18/18   Geiple, Joshua, PA-C  benzonatate  (TESSALON ) 100 MG capsule Take 1 capsule (100 mg total) by mouth every 8 (eight) hours. 01/18/18   Geiple, Joshua, PA-C  fluticasone  (FLONASE ) 50 MCG/ACT nasal spray Place 2 sprays into both nostrils daily. 01/02/21   Tonette Lauraine HERO, PA-C  ibuprofen  (ADVIL ,MOTRIN ) 200 MG tablet Take 200 mg by mouth every 4 (four) hours as needed for fever.    [provider]   naproxen sodium (ALEVE) 220 MG tablet Take 880 mg by mouth 2 (two) times daily as needed (pain).     [provider]  oseltamivir  (TAMIFLU ) 75 MG capsule Take 1 capsule (75 mg total) by mouth every 12 (twelve) hours. 01/02/21   Tonette Lauraine HERO, PA-C    Family History History reviewed. No pertinent family history.  Social History Social History   Tobacco Use   Smoking status: Former   Smokeless tobacco: Never  Substance Use Topics   Alcohol use: Yes    Comment: socially   Drug use: No     Allergies   Patient has no known allergies.   Review of Systems Review of Systems  Genitourinary:  Positive for frequency.     Physical Exam Triage Vital Signs ED Triage Vitals  Encounter Vitals Group     BP 10/01/23 1730 111/70     Girls Systolic BP Percentile --      Girls Diastolic BP Percentile --      Boys Systolic BP Percentile --      Boys Diastolic BP Percentile --      Pulse Rate 10/01/23 1730 77     Resp 10/01/23 1730 18     Temp 10/01/23 1730 97.8 F (36.6 C)     Temp Source 10/01/23 1730 Oral     SpO2 10/01/23 1730 97 %  Weight --      Height --      Head Circumference --      Peak Flow --      Pain Score 10/01/23 1728 0     Pain Loc --      Pain Education --      Exclude from Growth Chart --    No data found.  Updated Vital Signs BP 111/70 (BP Location: Right Arm)   Pulse 77   Temp 97.8 F (36.6 C) (Oral)   Resp 18   LMP 09/04/2023 (Exact Date)   SpO2 97%   Visual Acuity Right Eye Distance:   Left Eye Distance:   Bilateral Distance:    Right Eye Near:   Left Eye Near:    Bilateral Near:     Physical Exam Vitals and nursing note reviewed.  Constitutional:      Appearance: Normal appearance.  HENT:     Head: Normocephalic and atraumatic.  Eyes:     Pupils: Pupils are equal, round, and reactive to light.  Cardiovascular:     Rate and Rhythm: Normal rate.  Pulmonary:     Effort: Pulmonary effort is normal.  Abdominal:      Tenderness: There is no right CVA tenderness or left CVA tenderness.  Skin:    General: Skin is warm and dry.  Neurological:     General: No focal deficit present.     Mental Status: She is alert and oriented to person, place, and time.  Psychiatric:        Mood and Affect: Mood normal.        Behavior: Behavior normal.      UC Treatments / Results  Labs (all labs ordered are listed, but only abnormal results are displayed) Labs Reviewed  POCT URINE DIPSTICK - Abnormal; Notable for the following components:      Result Value   Clarity, UA cloudy (*)    Glucose, UA >=1,000 (*)    Blood, UA trace-intact (*)    Nitrite, UA Positive (*)    Leukocytes, UA Trace (*)    All other components within normal limits  GLUCOSE, POCT (MANUAL RESULT ENTRY) - Abnormal; Notable for the following components:   POC Glucose 255 (*)    All other components within normal limits  URINE CULTURE  POCT URINE PREGNANCY    EKG   Radiology No results found.  Procedures Procedures (including critical care time)  Medications Ordered in UC Medications - No data to display  Initial Impression / Assessment and Plan / UC Course  I have reviewed the triage vital signs and the nursing notes.  Pertinent labs & imaging results that were available during my care of the patient were reviewed by me and considered in my medical decision making (see chart for details).     Reviewed exam and symptoms with patient.  Patient took over-the-counter Uristat which may skew point-of-care testing, will send urine culture and treat based on symptoms while awaiting results with Keflex  twice daily for 7 days.  Urinalysis showed greater than 1000  glucose, patient denies personal history of diabetes but does report a strong history of diabetes.  2-hour fasting blood sugar was obtained in clinic showing 255.  Discussed with patient concern for diabetes.  She does not have a PCP so nursing staff was able to establish her  with 1 where she will follow-up for treatment and testing.  Advised to avoid high sugary foods drinks.  ER precautions reviewed. Final  Clinical Impressions(s) / UC Diagnoses   Final diagnoses:  Urinary frequency  Acute cystitis with hematuria  Glucose found in urine on examination  Hyperglycemia     Discharge Instructions      The clinic will contact you with results of the urine culture done today positive.  Start Keflex  twice daily for 7 days.  Lots of rest and fluids.  Your blood sugar taken was elevated indicating possible diabetes.  Please follow-up with your PCP that was scheduled for you by nursing staff today.  Your PCP will address this do additional testing and treatment if indicated.  Try to avoid high sugary foods and drinks.  Please go to the emergency room if you develop any worsening symptoms.  I hope you feel better soon!     ED Prescriptions     Medication Sig Dispense Auth. Provider   cephALEXin  (KEFLEX ) 500 MG capsule Take 1 capsule (500 mg total) by mouth 2 (two) times daily for 7 days. 14 capsule Lanique Gonzalo, Jodi R, NP      PDMP not reviewed this encounter.   Loreda Myla SAUNDERS, NP 10/01/23 1758

## 2023-10-01 NOTE — ED Triage Notes (Signed)
 Pt present with irritation during urination and frequent urination x  one week. Pt states she has lower abd pain, denies back pain.  Home interventions: Uricalm Pt has not been sexually active for 3 years.

## 2023-10-01 NOTE — Discharge Instructions (Addendum)
 The clinic will contact you with results of the urine culture done today positive.  Start Keflex  twice daily for 7 days.  Lots of rest and fluids.  Your blood sugar taken was elevated indicating possible diabetes.  Please follow-up with your PCP that was scheduled for you by nursing staff today.  Your PCP will address this do additional testing and treatment if indicated.  Try to avoid high sugary foods and drinks.  Please go to the emergency room if you develop any worsening symptoms.  I hope you feel better soon!

## 2023-10-03 LAB — URINE CULTURE: Culture: >=100000 — AB

## 2023-10-05 ENCOUNTER — Ambulatory Visit (HOSPITAL_COMMUNITY): Payer: Self-pay

## 2023-10-28 ENCOUNTER — Ambulatory Visit: Admitting: Physician Assistant
